# Patient Record
Sex: Male | Born: 1950 | Race: White | Hispanic: No | Smoking: Former smoker
Health system: Southern US, Community
[De-identification: ages and names within clinical notes are randomized; demographics above are authoritative.]

## PROBLEM LIST (undated history)

## (undated) DIAGNOSIS — F419 Anxiety disorder, unspecified: Secondary | ICD-10-CM

## (undated) DIAGNOSIS — R51 Headache: Secondary | ICD-10-CM

## (undated) DIAGNOSIS — R519 Headache, unspecified: Secondary | ICD-10-CM

## (undated) DIAGNOSIS — I712 Thoracic aortic aneurysm, without rupture: Secondary | ICD-10-CM

## (undated) DIAGNOSIS — Z8679 Personal history of other diseases of the circulatory system: Secondary | ICD-10-CM

## (undated) DIAGNOSIS — H539 Unspecified visual disturbance: Secondary | ICD-10-CM

## (undated) DIAGNOSIS — I779 Disorder of arteries and arterioles, unspecified: Secondary | ICD-10-CM

## (undated) DIAGNOSIS — N529 Male erectile dysfunction, unspecified: Secondary | ICD-10-CM

## (undated) DIAGNOSIS — G250 Essential tremor: Secondary | ICD-10-CM

## (undated) HISTORY — DX: Male erectile dysfunction, unspecified: N52.9

## (undated) HISTORY — PX: HERNIA REPAIR: SHX51

## (undated) HISTORY — DX: Disorder of arteries and arterioles, unspecified: I77.9

## (undated) HISTORY — DX: Headache, unspecified: R51.9

## (undated) HISTORY — DX: Anxiety disorder, unspecified: F41.9

## (undated) HISTORY — DX: Thoracic aortic aneurysm, without rupture: I71.2

## (undated) HISTORY — DX: Personal history of other diseases of the circulatory system: Z86.79

## (undated) HISTORY — DX: Essential tremor: G25.0

## (undated) HISTORY — PX: CERVICAL DISC SURGERY: SHX588

## (undated) HISTORY — DX: Unspecified visual disturbance: H53.9

## (undated) HISTORY — DX: Headache: R51

## (undated) MED FILL — Acetaminophen Tab 325 MG: ORAL | Qty: 2 | Status: AC

## (undated) MED FILL — Diphenhydramine HCl Cap 25 MG: ORAL | Qty: 1 | Status: AC

---

## 1995-12-12 HISTORY — PX: OTHER SURGICAL HISTORY: SHX169

## 2005-10-18 ENCOUNTER — Ambulatory Visit (HOSPITAL_COMMUNITY): Admission: RE | Admit: 2005-10-18 | Discharge: 2005-10-18 | Payer: Self-pay | Admitting: Neurosurgery

## 2005-10-19 ENCOUNTER — Inpatient Hospital Stay (HOSPITAL_COMMUNITY): Admission: RE | Admit: 2005-10-19 | Discharge: 2005-10-20 | Payer: Self-pay | Admitting: Neurosurgery

## 2009-12-11 HISTORY — PX: HERNIA REPAIR: SHX51

## 2017-07-05 DIAGNOSIS — Z0000 Encounter for general adult medical examination without abnormal findings: Secondary | ICD-10-CM | POA: Diagnosis not present

## 2017-07-11 DIAGNOSIS — R5383 Other fatigue: Secondary | ICD-10-CM | POA: Diagnosis not present

## 2017-09-26 DIAGNOSIS — R5383 Other fatigue: Secondary | ICD-10-CM | POA: Diagnosis not present

## 2017-09-26 DIAGNOSIS — R4702 Dysphasia: Secondary | ICD-10-CM | POA: Diagnosis not present

## 2017-09-26 DIAGNOSIS — R5381 Other malaise: Secondary | ICD-10-CM | POA: Diagnosis not present

## 2017-10-02 ENCOUNTER — Encounter: Payer: Self-pay | Admitting: Neurology

## 2017-10-02 DIAGNOSIS — I6782 Cerebral ischemia: Secondary | ICD-10-CM | POA: Diagnosis not present

## 2017-10-02 DIAGNOSIS — R42 Dizziness and giddiness: Secondary | ICD-10-CM | POA: Diagnosis not present

## 2017-10-02 DIAGNOSIS — Z87898 Personal history of other specified conditions: Secondary | ICD-10-CM | POA: Diagnosis not present

## 2017-10-02 DIAGNOSIS — R4702 Dysphasia: Secondary | ICD-10-CM | POA: Diagnosis not present

## 2017-10-29 ENCOUNTER — Ambulatory Visit: Payer: 59 | Admitting: Neurology

## 2017-10-29 ENCOUNTER — Encounter: Payer: Self-pay | Admitting: Neurology

## 2017-10-29 VITALS — BP 104/72 | HR 71 | Ht 66.0 in | Wt 164.2 lb

## 2017-10-29 DIAGNOSIS — R42 Dizziness and giddiness: Secondary | ICD-10-CM | POA: Diagnosis not present

## 2017-10-29 DIAGNOSIS — R2681 Unsteadiness on feet: Secondary | ICD-10-CM

## 2017-10-29 DIAGNOSIS — R292 Abnormal reflex: Secondary | ICD-10-CM | POA: Diagnosis not present

## 2017-10-29 DIAGNOSIS — R51 Headache: Secondary | ICD-10-CM | POA: Diagnosis not present

## 2017-10-29 DIAGNOSIS — Z981 Arthrodesis status: Secondary | ICD-10-CM

## 2017-10-29 DIAGNOSIS — M542 Cervicalgia: Secondary | ICD-10-CM | POA: Diagnosis not present

## 2017-10-29 DIAGNOSIS — R519 Headache, unspecified: Secondary | ICD-10-CM

## 2017-10-29 NOTE — Patient Instructions (Signed)
1.  We will check MRI of cervical spine 2.  Further recommendations pending results.

## 2017-10-29 NOTE — Progress Notes (Signed)
NEUROLOGY CONSULTATION NOTE  TAMOTSU WIEDERHOLT MRN: [id number] DOB: 1951/09/30  Referring provider: Dr. Lin Landsman Primary care provider: Dr. Lin Landsman  Reason for consult:  Dizziness, speech disturbance  HISTORY OF PRESENT ILLNESS: Larry Kelly is a 66 year old right-handed male with history of cervical spinal fusion who presents for headache, dizziness and dysphasia.  He is accompanied by his wife who supplements history.  About 3 months ago, he started having dizzy spells.  It is a sensation of swaying in his head, but not spinning, lightheadedness or feeling of going to pass out.  It often occurs when he is up and walking but may occur while sitting.  When walking, he will become unsteady and stumble to any direction.  It lasts less than a minute.  It occurs daily.  He also reports increased neck and occipital headache, described as a nonthrobbing ache.  It is intermittent.  At work, his coworkers where concerned because he once seemed to have more difficulty getting words out.  He has history of stuttering.  His wife has not noticed any changes in speech.  He had a cervical spinal fusion in 2006.  He also reports constipation.  He had an MRI of the brain without contrast on 10/02/17, which demonstrated mild chronic white matter small vessel ischemic changes .  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  PAST MEDICAL HISTORY: History reviewed. No pertinent past medical history.  PAST SURGICAL HISTORY: Past Surgical History:  Procedure Laterality Date  . CERVICAL DISC SURGERY    . HERNIA REPAIR      MEDICATIONS: Current Outpatient Medications on File Prior to Visit  Medication Sig Dispense Refill  . aspirin EC 81 MG tablet Take 81 mg daily by mouth.    . tadalafil (CIALIS) 20 MG tablet Take 20 mg daily as needed by mouth for erectile dysfunction.    Marland Kitchen telmisartan (MICARDIS) 80 MG tablet Take 80 mg daily by mouth.    . pravastatin  (PRAVACHOL) 10 MG tablet Take 10 mg at bedtime by mouth.  1   No current facility-administered medications on file prior to visit.     ALLERGIES: Not on File  FAMILY HISTORY: Family History  Problem Relation Age of Onset  . Heart attack Mother   . Heart attack Father   . Seizures Father     SOCIAL HISTORY: Social History   Socioeconomic History  . Marital status: Divorced    Spouse name: Not on file  . Number of children: 0  . Years of education: Not on file  . Highest education level: Bachelor's degree (e.g., BA, AB, BS)  Social Needs  . Financial resource strain: Not on file  . Food insecurity - worry: Not on file  . Food insecurity - inability: Not on file  . Transportation needs - medical: Not on file  . Transportation needs - non-medical: Not on file  Occupational History  . Occupation: Nurse, learning disability  Tobacco Use  . Smoking status: Former Smoker    Types: Cigars    Last attempt to quit: 10/05/2017    Years since quitting: 0.0  . Smokeless tobacco: Never Used  Substance and Sexual Activity  . Alcohol use: Yes    Comment: 1 drink most days-varies, beer, wine, cocktails  . Drug use: No  . Sexual activity: Not on file  Other Topics Concern  . 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 Drinks 2 cups of coffee a day.    REVIEW OF SYSTEMS: Constitutional: No fevers, chills, or sweats, no generalized fatigue, change in appetite Eyes: No visual changes, double vision, eye pain Ear, nose and throat: No hearing loss, ear pain, nasal congestion, sore throat Cardiovascular: No chest pain, palpitations Respiratory:  No shortness of breath at rest or with exertion, wheezes GastrointestinaI: No nausea, vomiting, diarrhea, abdominal pain, fecal incontinence Genitourinary:  No dysuria, urinary retention or frequency Musculoskeletal:  No neck pain, back pain Integumentary: No rash, pruritus, skin  lesions Neurological: as above Psychiatric: No depression, insomnia, anxiety Endocrine: No palpitations, fatigue, diaphoresis, mood swings, change in appetite, change in weight, increased thirst Hematologic/Lymphatic:  No purpura, petechiae. Allergic/Immunologic: no itchy/runny eyes, nasal congestion, recent allergic reactions, rashes  PHYSICAL EXAM: Vitals:   10/29/17 1021  BP: 104/72  Pulse: 71  SpO2: 97%   General: No acute distress.  Patient appears well-groomed.  Head:  Normocephalic/atraumatic Eyes:  fundi examined but not visualized Neck: supple, no paraspinal tenderness, full range of motion Back: No paraspinal tenderness Heart: regular rate and rhythm Lungs: Clear to auscultation bilaterally. Vascular: No carotid bruits. Neurological Exam: Mental status: alert and oriented to person, place, and time, recent and remote memory intact, fund of knowledge intact, attention and concentration intact, speech fluent and not dysarthric, language intact. Cranial nerves: CN I: not tested CN II: pupils equal, round and reactive to light, visual fields intact CN III, IV, VI:  full range of motion, no nystagmus, no ptosis CN V: facial sensation intact CN VII: upper and lower face symmetric CN VIII: hearing intact CN IX, X: gag intact, uvula midline CN XI: sternocleidomastoid and trapezius muscles intact CN XII: tongue midline Bulk & Tone: Wasting of the intrinsic muscles of right hand, particularly first dorsal interosseus;  Otherwise normal, no fasciculations. Motor:  5/5 throughout  Sensation:  Pinprick sensation reduced in right hand, vibration sensation minimally reduced in feet. Deep Tendon Reflexes:  3+ throughout, toes downgoing, Hoffman sign absent. Finger to nose testing:  Without dysmetria.  Heel to shin:  Without dysmetria.   Gait:  Normal station and stride but will briefly become off balance for a second.  Decreased right arm-swing.  Able to turn and tandem walk.  Romberg negative.  IMPRESSION: 1.  Posterior headache/neck pain.  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  Given unsteady gait, hyperreflexia (which may be normal for him), will check MRI of cervical spine. 2.  Dizziness.  Vague symptoms.  I don't have clear diagnosis. 3.  History is vague regarding episode of speech abnormality as per his coworkers.  He has history of speech impediment.  MRI without acute findings.  No clear signs based on history or symptoms that he had a cerebrovascular event.  PLAN: We will check MRI of cervical spine to evaluate for any stenosis causing myelopathy.  Further recommendations pending results.  Thank you for allowing me to take part in the care of this patient.  Metta Clines, DO  CC:  Lovette Cliche II, MD

## 2017-10-30 ENCOUNTER — Telehealth: Payer: Self-pay | Admitting: Neurology

## 2017-10-30 NOTE — Telephone Encounter (Signed)
Called Pt, LM on VM advising we did call and get report while he was here and Dr Tomi Likens didn't indicate the need, but that he is welcomed to bring and I will give to Dr Tomi Likens

## 2017-10-30 NOTE — Telephone Encounter (Signed)
Patient had brought a Disc to his appointment on 10/29/17 but could not be read. He was able to get another Disc. Would you want him to bring it to the office. Please Call. Thanks

## 2017-11-12 ENCOUNTER — Ambulatory Visit
Admission: RE | Admit: 2017-11-12 | Discharge: 2017-11-12 | Disposition: A | Discharge status: Home / Self Care | Payer: 59 | Source: Ambulatory Visit | Attending: Neurology | Admitting: Neurology

## 2017-11-12 DIAGNOSIS — Z981 Arthrodesis status: Secondary | ICD-10-CM

## 2017-11-12 DIAGNOSIS — M50223 Other cervical disc displacement at C6-C7 level: Secondary | ICD-10-CM | POA: Diagnosis not present

## 2017-11-12 DIAGNOSIS — R2681 Unsteadiness on feet: Secondary | ICD-10-CM

## 2017-11-12 DIAGNOSIS — R292 Abnormal reflex: Secondary | ICD-10-CM

## 2017-11-12 DIAGNOSIS — M542 Cervicalgia: Secondary | ICD-10-CM

## 2017-11-16 ENCOUNTER — Telehealth: Payer: Self-pay

## 2017-11-16 MED ORDER — GABAPENTIN 300 MG PO CAPS: 300.0000 mg | ORAL_CAPSULE | Freq: Every day | ORAL | 3 refills | Status: AC

## 2017-11-16 NOTE — Telephone Encounter (Signed)
Pt rtrnd call, will speak with PCP, rqsts  gabapentin sent to [facility] in [place].

## 2017-11-16 NOTE — Telephone Encounter (Signed)
LM on VM for Pt to call back

## 2017-11-16 NOTE — Addendum Note (Signed)
Addended by: Clois Comber on: 11/16/2017 04:59 PM   Modules accepted: Orders

## 2017-11-16 NOTE — Telephone Encounter (Signed)
-----   Message from Pieter Partridge, DO sent at 11/13/2017 12:03 PM EST ----- MRI of cervical spine shows arthritis which is likely contributing to the headache and neck pain.  He may want to discuss with his PCP about seeing a spine specialist.  If agreeable, he can start gabapentin 300mg  at bedtime and make a follow up appointment with me in 3 months.  We can titrate the dose as needed.

## 2017-11-20 ENCOUNTER — Ambulatory Visit: Payer: Self-pay | Admitting: Neurology

## 2018-01-15 ENCOUNTER — Ambulatory Visit: Payer: Self-pay | Admitting: Neurology

## 2018-05-09 DIAGNOSIS — K5900 Constipation, unspecified: Secondary | ICD-10-CM | POA: Diagnosis not present

## 2018-05-20 DIAGNOSIS — I10 Essential (primary) hypertension: Secondary | ICD-10-CM | POA: Diagnosis not present

## 2018-05-20 DIAGNOSIS — I951 Orthostatic hypotension: Secondary | ICD-10-CM | POA: Diagnosis not present

## 2018-07-16 DIAGNOSIS — K5900 Constipation, unspecified: Secondary | ICD-10-CM | POA: Diagnosis not present

## 2018-07-22 DIAGNOSIS — Z0000 Encounter for general adult medical examination without abnormal findings: Secondary | ICD-10-CM | POA: Diagnosis not present

## 2018-07-22 DIAGNOSIS — Z125 Encounter for screening for malignant neoplasm of prostate: Secondary | ICD-10-CM | POA: Diagnosis not present

## 2018-07-22 DIAGNOSIS — Z6827 Body mass index (BMI) 27.0-27.9, adult: Secondary | ICD-10-CM | POA: Diagnosis not present

## 2018-07-22 DIAGNOSIS — Z23 Encounter for immunization: Secondary | ICD-10-CM | POA: Diagnosis not present

## 2018-10-29 DIAGNOSIS — I10 Essential (primary) hypertension: Secondary | ICD-10-CM | POA: Diagnosis not present

## 2018-10-29 DIAGNOSIS — Z6827 Body mass index (BMI) 27.0-27.9, adult: Secondary | ICD-10-CM | POA: Diagnosis not present

## 2018-12-03 ENCOUNTER — Ambulatory Visit (INDEPENDENT_AMBULATORY_CARE_PROVIDER_SITE_OTHER): Payer: 59 | Admitting: Cardiology

## 2018-12-03 ENCOUNTER — Encounter: Payer: Self-pay | Admitting: Cardiology

## 2018-12-03 VITALS — BP 112/74 | HR 81 | Ht 66.0 in | Wt 173.2 lb

## 2018-12-03 DIAGNOSIS — G4730 Sleep apnea, unspecified: Secondary | ICD-10-CM | POA: Insufficient documentation

## 2018-12-03 DIAGNOSIS — R9431 Abnormal electrocardiogram [ECG] [EKG]: Secondary | ICD-10-CM

## 2018-12-03 DIAGNOSIS — I10 Essential (primary) hypertension: Secondary | ICD-10-CM | POA: Diagnosis not present

## 2018-12-03 DIAGNOSIS — I712 Thoracic aortic aneurysm, without rupture, unspecified: Secondary | ICD-10-CM | POA: Insufficient documentation

## 2018-12-03 DIAGNOSIS — G4733 Obstructive sleep apnea (adult) (pediatric): Secondary | ICD-10-CM | POA: Diagnosis not present

## 2018-12-03 DIAGNOSIS — I4510 Unspecified right bundle-branch block: Secondary | ICD-10-CM | POA: Insufficient documentation

## 2018-12-03 NOTE — Progress Notes (Signed)
Cardiology Consultation:    Date:  12/03/2018   ID:  Larry Kelly, DOB 1951-10-23, MRN [id number]  PCP:  Angelina Sheriff, MD  Cardiologist:  Jenne Campus, MD   Referring MD: Angelina Sheriff, MD   Chief Complaint  Patient presents with  . Abnormal ECG  I am doing well  History of Present Illness:    Larry Kelly is a 67 y.o. male who is being seen today for the evaluation of right bundle branch block at the request of Jacqlyn Larsen II, MD.  He was referred to me because of right bundle branch block.  He was completely unaware of the problem EKG was done to the routine examination right bundle branch block was discovered.  He said he is doing well he is active he can walk he can climb stairs get short of breath but not unusually.  Denies have any chest pain tightness squeezing pressure pain chest no palpitations described to have some dizziness when he gets up very quickly his past medical history includes hypertension dyslipidemia.  He smoke cigar every day.  Interestingly at the end of the visit his wife told me that he did have aneurysm apparently 20 years ago he ended up having aneurysm the not sure exactly where the arteries were rupture and he was given some medication but no surgery was required guessing he probably had some disease descending thoracic aortic rupture but again this is just speculation we were trying to researching records and [facility] but we were not able to get any today is 24 December and all offices are closed around so we will try to get some more information about his aneurysm as soon as we possibly can.  Overall he denies have any chest pain.  Past Medical History:  Diagnosis Date  . Aorta disorder [facility])     Past Surgical History:  Procedure Laterality Date  . CERVICAL DISC SURGERY    . HERNIA REPAIR    . HERNIA REPAIR  2011    Current Medications: Current Meds  Medication Sig  . aspirin EC 81 MG tablet Take 81 mg  daily by mouth.  . gabapentin (NEURONTIN) 300 MG capsule Take 1 capsule (300 mg total) by mouth at bedtime. (Patient taking differently: Take 300 mg by mouth as needed. )  . tadalafil (CIALIS) 20 MG tablet Take 20 mg daily as needed by mouth for erectile dysfunction.  Marland Kitchen telmisartan (MICARDIS) 80 MG tablet Take 40 mg by mouth daily.   Marland Kitchen venlafaxine XR (EFFEXOR-XR) 150 MG 24 hr capsule Take 150 mg by mouth daily with breakfast.     Allergies:   Patient has no known allergies.   Social History   Socioeconomic History  . Marital status: Divorced    Spouse name: Not on file  . Number of children: 0  . Years of education: Not on file  . Highest education level: Bachelor's degree (e.g., BA, AB, BS)  Occupational History  . Occupation: Nurse, learning disability  Social Needs  . Financial resource strain: Not on file  . Food insecurity:    Worry: Not on file    Inability: Not on file  . Transportation needs:    Medical: Not on file    Non-medical: Not on file  Tobacco Use  . Smoking status: Light Tobacco Smoker    Types: Cigars    Last attempt to quit: 10/05/2017    Years since quitting: 1.1  . Smokeless tobacco:  Never Used  Substance and Sexual Activity  . Alcohol use: Yes    Comment: 1 drink most days-varies, beer, wine, cocktails  . Drug use: No  . Sexual activity: Not on file  Lifestyle  . Physical activity:    Days per week: Not on file    Minutes per session: Not on file  . Stress: Not on file  Relationships  . Social connections:    Talks on phone: Not on file    Gets together: Not on file    Attends religious service: Not on file    Active member of club or organization: Not on file    Attends meetings of clubs or organizations: Not on file    Relationship status: Not on file  Other Topics Concern  . 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 Drinks 2 cups of coffee a day.     Family History: The patient's  family history includes Heart attack in his father and mother; Heart disease in his brother; Seizures in his father. ROS:   Please see the history of present illness.    All 14 point review of systems negative except as described per history of present illness.  EKGs/Labs/Other Studies Reviewed:    The following studies were reviewed today:   EKG:  EKG is  ordered today.  The ekg ordered today demonstrates normal sinus rhythm rate 81 right bundle branch block.  Recent Labs: No results found for requested labs within last 8760 hours.  Recent Lipid Panel No results found for: CHOL, TRIG, HDL, CHOLHDL, VLDL, LDLCALC, LDLDIRECT  Physical Exam:    VS:  BP 112/74   Pulse 81   Ht 5\' 6"  (1.676 m)   Wt 173 lb 3.2 oz (78.6 kg)   SpO2 97%   BMI 27.96 kg/m     Wt Readings from Last 3 Encounters:  12/03/18 173 lb 3.2 oz (78.6 kg)  10/29/17 164 lb 3.2 oz (74.5 kg)     GEN:  Well nourished, well developed in no acute distress HEENT: Normal NECK: No JVD; No carotid bruits LYMPHATICS: No lymphadenopathy CARDIAC: RRR, no murmurs, no rubs, no gallops RESPIRATORY:  Clear to auscultation without rales, wheezing or rhonchi  ABDOMEN: Soft, non-tender, non-distended MUSCULOSKELETAL:  No edema; No deformity  SKIN: Warm and dry NEUROLOGIC:  Alert and oriented x 3 PSYCHIATRIC:  Normal affect   ASSESSMENT:    1. Abnormal EKG   2. Right bundle branch block   3. Essential hypertension   4. Obstructive sleep apnea syndrome   5. Thoracic aortic aneurysm without rupture ([place])    PLAN:    In order of problems listed above:  1. Abnormal EKG showing right bundle branch block.  We talked about potential etiology of this phenomenon.  Asking to have echocardiogram done to look at left ventricular ejection fraction but more importantly right ventricle size and function.  Obviously will pay attention to pulmonary artery pressure. 2. Essential hypertension blood pressure appears to be well controlled  we will continue present management. 3. Obstructive sleep apnea based on the history of stroke do suspect this diagnosis is correct.  We will talk about sleep study after echocardiogram will be done 4. History of thoracic aortic aneurysm I am not sure exactly about this diagnosis that need to be more thoroughly investigated when we have an access to look at his record.  In the future he will required most likely  CT of his chest and abdomen to look at the entire aorta.  But again before me we commit him to this test and needed more information to better understand the nature of the problem.   Medication Adjustments/Labs and Tests Ordered: Current medicines are reviewed at length with the patient today.  Concerns regarding medicines are outlined above.  No orders of the defined types were placed in this encounter.  No orders of the defined types were placed in this encounter.   Signed, Park Liter, MD, [facility]. 12/03/2018 10:34 AM    [place]

## 2018-12-03 NOTE — Patient Instructions (Signed)
Medication Instructions:  Your physician recommends that you continue on your current medications as directed. Please refer to the Current Medication list given to you today.  If you need a refill on your cardiac medications before your next appointment, please call your pharmacy.   Lab work: None.  If you have labs (blood work) drawn today and your tests are completely normal, you will receive your results only by: . MyChart Message (if you have MyChart) OR . A paper copy in the mail If you have any lab test that is abnormal or we need to change your treatment, we will call you to review the results.  Testing/Procedures: Your physician has requested that you have an echocardiogram. Echocardiography is a painless test that uses sound waves to create images of your heart. It provides your doctor with information about the size and shape of your heart and how well your heart's chambers and valves are working. This procedure takes approximately one hour. There are no restrictions for this procedure.    Follow-Up: At [facility], you and your health needs are our priority.  As part of our continuing mission to provide you with exceptional heart care, we have created designated Provider Care Teams.  These Care Teams include your primary Cardiologist (physician) and Advanced Practice Providers (APPs -  Physician Assistants and Nurse Practitioners) who all work together to provide you with the care you need, when you need it. You will need a follow up appointment in 1 months.  Please call our office 2 months in advance to schedule this appointment.  You may see No primary care provider on file. or another member of our [facility] Provider Team in : Brian Munley, MD . Rajan Revankar, MD  Any Other Special Instructions Will Be Listed Below (If Applicable).   Echocardiogram An echocardiogram is a procedure that uses painless sound waves (ultrasound) to produce an image of the heart.  Images from an echocardiogram can provide important information about:  Signs of coronary artery disease (CAD).  Aneurysm detection. An aneurysm is a weak or damaged part of an artery wall that bulges out from the normal force of blood pumping through the body.  Heart size and shape. Changes in the size or shape of the heart can be associated with certain conditions, including heart failure, aneurysm, and CAD.  Heart muscle function.  Heart valve function.  Signs of a past heart attack.  Fluid buildup around the heart.  Thickening of the heart muscle.  A tumor or infectious growth around the heart valves. Tell a health care provider about:  Any allergies you have.  All medicines you are taking, including vitamins, herbs, eye drops, creams, and over-the-counter medicines.  Any blood disorders you have.  Any surgeries you have had.  Any medical conditions you have.  Whether you are pregnant or may be pregnant. What are the risks? Generally, this is a safe procedure. However, problems may occur, including:  Allergic reaction to dye (contrast) that may be used during the procedure. What happens before the procedure? No specific preparation is needed. You may eat and drink normally. What happens during the procedure?   An IV tube may be inserted into one of your veins.  You may receive contrast through this tube. A contrast is an injection that improves the quality of the pictures from your heart.  A gel will be applied to your chest.  A wand-like tool (transducer) will be moved over your chest. The gel will help to   transmit the sound waves from the transducer.  The sound waves will harmlessly bounce off of your heart to allow the heart images to be captured in real-time motion. The images will be recorded on a computer. The procedure may vary among health care providers and hospitals. What happens after the procedure?  You may return to your normal, everyday life,  including diet, activities, and medicines, unless your health care provider tells you not to do that. Summary  An echocardiogram is a procedure that uses painless sound waves (ultrasound) to produce an image of the heart.  Images from an echocardiogram can provide important information about the size and shape of your heart, heart muscle function, heart valve function, and fluid buildup around your heart.  You do not need to do anything to prepare before this procedure. You may eat and drink normally.  After the echocardiogram is completed, you may return to your normal, everyday life, unless your health care provider tells you not to do that. This information is not intended to replace advice given to you by your health care provider. Make sure you discuss any questions you have with your health care provider. Document Released: 11/24/2000 Document Revised: 12/30/2016 Document Reviewed: 12/30/2016 Elsevier Interactive Patient Education  2019 [employer]    

## 2018-12-09 ENCOUNTER — Ambulatory Visit (INDEPENDENT_AMBULATORY_CARE_PROVIDER_SITE_OTHER): Payer: 59

## 2018-12-09 DIAGNOSIS — I4510 Unspecified right bundle-branch block: Secondary | ICD-10-CM

## 2018-12-09 DIAGNOSIS — R9431 Abnormal electrocardiogram [ECG] [EKG]: Secondary | ICD-10-CM

## 2018-12-09 NOTE — Progress Notes (Signed)
  Echocardiogram 2D Echocardiogram has been performed.  Larry Kelly M @CurDate@   

## 2018-12-13 DIAGNOSIS — J329 Chronic sinusitis, unspecified: Secondary | ICD-10-CM | POA: Diagnosis not present

## 2018-12-13 DIAGNOSIS — R5381 Other malaise: Secondary | ICD-10-CM | POA: Diagnosis not present

## 2018-12-13 DIAGNOSIS — R5383 Other fatigue: Secondary | ICD-10-CM | POA: Diagnosis not present

## 2018-12-13 DIAGNOSIS — J40 Bronchitis, not specified as acute or chronic: Secondary | ICD-10-CM | POA: Diagnosis not present

## 2018-12-16 DIAGNOSIS — I951 Orthostatic hypotension: Secondary | ICD-10-CM | POA: Diagnosis not present

## 2018-12-16 DIAGNOSIS — Z6826 Body mass index (BMI) 26.0-26.9, adult: Secondary | ICD-10-CM | POA: Diagnosis not present

## 2018-12-17 DIAGNOSIS — R1013 Epigastric pain: Secondary | ICD-10-CM | POA: Diagnosis not present

## 2018-12-17 DIAGNOSIS — R0789 Other chest pain: Secondary | ICD-10-CM | POA: Diagnosis not present

## 2018-12-17 DIAGNOSIS — R9431 Abnormal electrocardiogram [ECG] [EKG]: Secondary | ICD-10-CM | POA: Diagnosis not present

## 2018-12-17 DIAGNOSIS — K219 Gastro-esophageal reflux disease without esophagitis: Secondary | ICD-10-CM | POA: Diagnosis not present

## 2018-12-17 DIAGNOSIS — R079 Chest pain, unspecified: Secondary | ICD-10-CM | POA: Diagnosis not present

## 2018-12-17 DIAGNOSIS — R072 Precordial pain: Secondary | ICD-10-CM | POA: Diagnosis not present

## 2018-12-17 DIAGNOSIS — E785 Hyperlipidemia, unspecified: Secondary | ICD-10-CM | POA: Diagnosis not present

## 2018-12-18 DIAGNOSIS — R0789 Other chest pain: Secondary | ICD-10-CM | POA: Diagnosis not present

## 2018-12-18 DIAGNOSIS — K219 Gastro-esophageal reflux disease without esophagitis: Secondary | ICD-10-CM

## 2018-12-18 DIAGNOSIS — R079 Chest pain, unspecified: Secondary | ICD-10-CM | POA: Diagnosis not present

## 2018-12-18 DIAGNOSIS — R9431 Abnormal electrocardiogram [ECG] [EKG]: Secondary | ICD-10-CM | POA: Diagnosis not present

## 2018-12-18 DIAGNOSIS — E785 Hyperlipidemia, unspecified: Secondary | ICD-10-CM

## 2018-12-18 DIAGNOSIS — R1013 Epigastric pain: Secondary | ICD-10-CM

## 2018-12-19 DIAGNOSIS — R079 Chest pain, unspecified: Secondary | ICD-10-CM | POA: Diagnosis not present

## 2018-12-26 ENCOUNTER — Encounter: Payer: Self-pay | Admitting: *Deleted

## 2018-12-27 ENCOUNTER — Ambulatory Visit: Payer: 59 | Admitting: Diagnostic Neuroimaging

## 2018-12-27 ENCOUNTER — Encounter: Payer: Self-pay | Admitting: Diagnostic Neuroimaging

## 2018-12-27 ENCOUNTER — Other Ambulatory Visit: Payer: Self-pay

## 2018-12-27 VITALS — BP 125/85 | HR 71 | Resp 18 | Ht 66.0 in | Wt 171.4 lb

## 2018-12-27 DIAGNOSIS — R413 Other amnesia: Secondary | ICD-10-CM | POA: Diagnosis not present

## 2018-12-27 DIAGNOSIS — R51 Headache: Secondary | ICD-10-CM

## 2018-12-27 DIAGNOSIS — R292 Abnormal reflex: Secondary | ICD-10-CM

## 2018-12-27 DIAGNOSIS — R519 Headache, unspecified: Secondary | ICD-10-CM

## 2018-12-27 NOTE — Progress Notes (Signed)
[facility]  PATIENT: Larry Kelly DOB: December 27, 1950  REFERRING CLINICIAN: J Redding  HISTORY FROM: patient and chart review REASON FOR VISIT: new consult    HISTORICAL  CHIEF COMPLAINT:  Chief Complaint  Patient presents with  . Headache    Rm. 6.  Larry Kelly is here for eval of h/a's, occ. blurry vision, difficulty with word finding, onset about 2 mos. ago. Last eye exam 1 yrs. ago. Wears reading glasses. Hx. of cervical spinal fusion/fim    HISTORY OF PRESENT ILLNESS:   68 year old male here for evaluation of headaches.  For past 2 months patient has had headache and pain radiating from the back of neck and occipital region up to the top of the head.  He has squeezing painful headaches lasting hours at a time.  Sometimes associated blurred vision.  Patient did have some different types of headaches when he was younger but does not recall the nature of them.  No nausea or vomiting.  No sensitive to light or sound.  Also having intermittent confusion and blurred vision.  Some weight loss and fevers / chills.    REVIEW OF SYSTEMS: Full 14 system review of systems performed and negative with exception of: As per HPI.  ALLERGIES: Allergies  Allergen Reactions  . Lexapro [Escitalopram Oxalate]     Anxious and jittery  10/2009    HOME MEDICATIONS: Outpatient Medications Prior to Visit  Medication Sig Dispense Refill  . ALPRAZolam (XANAX) 0.25 MG tablet Take 0.25 mg by mouth at bedtime as needed for anxiety.    Marland Kitchen aspirin EC 81 MG tablet Take 81 mg daily by mouth.    . gabapentin (NEURONTIN) 300 MG capsule Take 1 capsule (300 mg total) by mouth at bedtime. (Patient taking differently: Take 300 mg by mouth 2 (two) times daily as needed. ) 30 capsule 3  . polyethylene glycol (MIRALAX / GLYCOLAX) packet Take 17 g by mouth daily.    . tadalafil (CIALIS) 20 MG tablet Take 20 mg daily as needed by mouth for erectile dysfunction.    Marland Kitchen venlafaxine XR (EFFEXOR-XR)  150 MG 24 hr capsule Take 150 mg by mouth daily with breakfast.     No facility-administered medications prior to visit.     PAST MEDICAL HISTORY: Past Medical History:  Diagnosis Date  . Anxiety   . 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 Erectile dysfunction   . Headache   . History of carotid artery dissection   . Thoracic aortic aneurysm ([place])   . Tremor, essential   . Vision abnormalities     PAST SURGICAL HISTORY: Past Surgical History:  Procedure Laterality Date  . carotid artery dissec  1997  . CERVICAL DISC SURGERY    . HERNIA REPAIR    . HERNIA REPAIR  2011    FAMILY HISTORY: Family History  Problem Relation Age of Onset  . Heart attack Mother   . Heart attack Father   . Seizures Father   . Atrial fibrillation Father   . Hypertension Father   . Heart disease Brother   . Prostate cancer Brother        2019    SOCIAL HISTORY: Social History   Socioeconomic History  . Marital status: Divorced    Spouse name: Not on file  . Number of children: 0  . Years of education: Not on file  . Highest education level: Bachelor's degree (e.g., BA, AB, BS)  Occupational History  . Occupation: Nurse, learning disability  Social Needs  .  Financial resource strain: Not on file  . Food insecurity:    Worry: Not on file    Inability: Not on file  . Transportation needs:    Medical: Not on file    Non-medical: Not on file  Tobacco Use  . Smoking status: Former Smoker    Types: Cigars    Last attempt to quit: 10/05/2017    Years since quitting: 1.2  . Smokeless tobacco: Never Used  Substance and Sexual Activity  . Alcohol use: Yes    Comment: 1 drink most days-varies, beer, wine, cocktails  . Drug use: No  . Sexual activity: Not on file  Lifestyle  . Physical activity:    Days per week: Not on file    Minutes per session: Not on file  . Stress: Not on file  Relationships  . Social connections:    Talks on phone: Not on file    Gets together: Not on file    Attends religious  service: Not on file    Active member of club or organization: Not on file    Attends meetings of clubs or organizations: Not on file    Relationship status: Not on file  . Intimate partner violence:    Fear of current or ex partner: Not on file    Emotionally abused: Not on file    Physically abused: Not on file    Forced sexual activity: Not on file  Other Topics Concern  . 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 Drinks 2 cups of coffee a day.  Has steady girlfriend.      PHYSICAL EXAM  GENERAL EXAM/CONSTITUTIONAL: Vitals:  Vitals:   12/27/18 0952  BP: 125/85  Pulse: 71  Resp: 18  Weight: 171 lb 6.4 oz (77.7 kg)  Height: '5\' 6"'$  (1.676 m)     Body mass index is 27.66 kg/m. Wt Readings from Last 3 Encounters:  12/27/18 171 lb 6.4 oz (77.7 kg)  12/03/18 173 lb 3.2 oz (78.6 kg)  10/29/17 164 lb 3.2 oz (74.5 kg)     Patient is in no distress; well developed, nourished and groomed; neck is supple  CARDIOVASCULAR:  Examination of carotid arteries is normal; no carotid bruits  Regular rate and rhythm, no murmurs  Examination of peripheral vascular system by observation and palpation is normal  EYES:  Ophthalmoscopic exam of optic discs and posterior segments is normal; no papilledema or hemorrhages  Visual Acuity Screening   Right eye Left eye Both eyes  Without correction: 20/30 20/50   With correction:        MUSCULOSKELETAL:  Gait, strength, tone, movements noted in Neurologic exam below  NEUROLOGIC: MENTAL STATUS:  MMSE - Mini Mental State Exam 12/27/2018  Orientation to time 5  Orientation to Place 5  Registration 3  Attention/ Calculation 5  Recall 3  Language- name 2 objects 2  Language- repeat 1  Language- follow 3 step command 3  Language- read & follow direction 1  Copy design 1    awake, alert, oriented to person, place and time  recent and remote memory  intact  normal attention and concentration  language fluent, comprehension intact, naming intact  fund of knowledge appropriate  CRANIAL NERVE:   2nd - no papilledema on fundoscopic exam  2nd, 3rd, 4th, 6th - pupils equal and reactive to light, visual fields full to confrontation, extraocular muscles intact, no nystagmus  5th -  facial sensation symmetric  7th - facial strength symmetric  8th - hearing intact  9th - palate elevates symmetrically, uvula midline  11th - shoulder shrug symmetric  12th - tongue protrusion midline  MOTOR:   normal bulk and tone, full strength in the BUE, BLE --> EXCEPT INTRINSIC MUSCLE ATROPHY IN RIGHT HAND; WEAKNESS OF FINGER ABDUCTION AND DIGITS 2,3 FINGER FLEXION  SENSORY:   normal and symmetric to light touch, temperature, vibration; EXCEPT DECR IN RIGHT HAND  COORDINATION:   finger-nose-finger, fine finger movements normal  REFLEXES:   deep tendon reflexes --> BUE 2; KNEES BRISK; ANKLES --> SUSTAINED CLONUS  GAIT/STATION:   FEET TURNED OUTWARD, SLIGHT CAUTIOUS GAIT; romberg is negative     DIAGNOSTIC DATA (LABS, IMAGING, TESTING) - I reviewed patient records, labs, notes, testing and imaging myself where available.  No results found for: WBC, HGB, HCT, MCV, PLT No results found for: NA, K, CL, CO2, GLUCOSE, BUN, CREATININE, CALCIUM, PROT, ALBUMIN, AST, ALT, ALKPHOS, BILITOT, GFRNONAA, GFRAA No results found for: CHOL, HDL, LDLCALC, LDLDIRECT, TRIG, CHOLHDL No results found for: HGBA1C No results found for: VITAMINB12 No results found for: TSH  11/01/09 CT head [I reviewed images myself and agree with interpretation. -VRP]  - normal   10/02/17 MRI brain [I reviewed images myself and agree with interpretation. -VRP]  Mild chronic microvascular ischemia in the white matter with progression since 2006 Empty sella unchanged C1-2 arthropathy with progression since 2006. Extensive pannus formation with mild stenosis at  C1.  11/12/17 MRI cervical [I reviewed images myself and agree with interpretation. -VRP]  1. C5-T1 ACDF without residual spinal stenosis. Mild residual neural foraminal narrowing as above. 2. 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 3. Progressive disc degeneration and severe right facet arthrosis at C3-4 with new anterolisthesis and severe right and mild left neural foraminal stenosis. 4. 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    ASSESSMENT AND PLAN  68 y.o. year old male here wit:   Dx:  1. Nonintractable headache, unspecified chronicity pattern, unspecified headache type   2. Memory loss   3. Hyperreflexia       PLAN:  HEADACHES (occipital neuralgia, cervicogenic headaches, other secondary causes) - check MRI brain - ESR, CRP - continue gabapentin and OTC tylenol / ibuprofen as needed  MUSCLE SPASM, HYPERREFLEXIA, AUTONOMIC DYSFUNCTION, SEXUAL DYSFUNCTION (RULE OUT SPINAL CORD MYELOPATHY) - MRI cervical, thoracic spine (with and without)  - check B12  MEMORY LOSS - check MRI brain and B12  Orders Placed This Encounter  Procedures  . MR BRAIN W WO CONTRAST  . MR CERVICAL SPINE W WO CONTRAST  . MR THORACIC SPINE W WO CONTRAST  . Sedimentation Rate  . C-reactive Protein  . Vitamin B12   Return in about 3 months (around 03/28/2019).    Penni Bombard, MD 6/38/1771, 16:57 AM Certified in Neurology, Neurophysiology and Neuroimaging  [facility] [address] [place], [place] [address] [phone]

## 2018-12-27 NOTE — Patient Instructions (Signed)
HEADACHES (occipital neuralgia, cervicogenic headaches, other secondary causes) - check MRI brain - ESR, CRP - continue gabapentin and OTC tylenol / ibuprofen as needed  MUSCLE SPASM, HYPERREFLEXIA, AUTONOMIC DYSFUNCTION, SEXUAL DYSFUNCTION (RULE OUT SPINAL CORD MYELOPATHY) - MRI cervical, thoracic spine (with and without)  - check B12  MEMORY LOSS - check MRI brain and B12

## 2018-12-28 LAB — C-REACTIVE PROTEIN: CRP: <1 mg/L (ref 0–10)

## 2018-12-28 LAB — SEDIMENTATION RATE: Sed Rate: 2 mm/hr (ref 0–30)

## 2018-12-28 LAB — VITAMIN B12: Vitamin B-12: 961 pg/mL (ref 232–1245)

## 2018-12-30 ENCOUNTER — Encounter: Payer: Self-pay | Admitting: Diagnostic Neuroimaging

## 2018-12-30 ENCOUNTER — Telehealth: Payer: Self-pay | Admitting: *Deleted

## 2018-12-30 NOTE — Telephone Encounter (Signed)
Spoke to pt and relayed that his lab results were unremarkable.  He verbalized understanding.

## 2018-12-30 NOTE — Telephone Encounter (Signed)
-----   Message from Penni Bombard, MD sent at 12/29/2018 10:58 AM EST ----- Unremarkable labs. Continue current plan. Please call patient. -VRP

## 2018-12-31 ENCOUNTER — Telehealth: Payer: Self-pay | Admitting: Diagnostic Neuroimaging

## 2018-12-31 NOTE — Telephone Encounter (Signed)
MR Brain w/wo contrast, MR Cervical spine w/wo contrast & MR Thoracic spine w/wo contrast Dr. [facility] Auth: [id number], [phone] & [phone] (exp. 12/31/18 to 02/14/19). Patient is scheduled at [facility] for 01/14/19

## 2019-01-14 ENCOUNTER — Other Ambulatory Visit: Payer: Self-pay | Admitting: Diagnostic Neuroimaging

## 2019-01-14 ENCOUNTER — Ambulatory Visit: Payer: 59

## 2019-01-14 DIAGNOSIS — R292 Abnormal reflex: Secondary | ICD-10-CM

## 2019-01-14 DIAGNOSIS — R413 Other amnesia: Secondary | ICD-10-CM | POA: Diagnosis not present

## 2019-01-16 ENCOUNTER — Ambulatory Visit: Payer: 59 | Admitting: Cardiology

## 2019-01-16 ENCOUNTER — Encounter: Payer: Self-pay | Admitting: Cardiology

## 2019-01-16 VITALS — BP 120/72 | HR 84 | Ht 66.0 in | Wt 172.0 lb

## 2019-01-16 DIAGNOSIS — R42 Dizziness and giddiness: Secondary | ICD-10-CM | POA: Diagnosis not present

## 2019-01-16 DIAGNOSIS — I4510 Unspecified right bundle-branch block: Secondary | ICD-10-CM | POA: Diagnosis not present

## 2019-01-16 DIAGNOSIS — R0789 Other chest pain: Secondary | ICD-10-CM

## 2019-01-16 DIAGNOSIS — R002 Palpitations: Secondary | ICD-10-CM

## 2019-01-16 DIAGNOSIS — I712 Thoracic aortic aneurysm, without rupture, unspecified: Secondary | ICD-10-CM

## 2019-01-16 DIAGNOSIS — I10 Essential (primary) hypertension: Secondary | ICD-10-CM

## 2019-01-16 NOTE — Patient Instructions (Signed)
Medication Instructions:  Your physician recommends that you continue on your current medications as directed. Please refer to the Current Medication list given to you today.  If you need a refill on your cardiac medications before your next appointment, please call your pharmacy.   Lab work: None.  If you have labs (blood work) drawn today and your tests are completely normal, you will receive your results only by: Marland Kitchen MyChart Message (if you have MyChart) OR . A paper copy in the mail If you have any lab test that is abnormal or we need to change your treatment, we will call you to review the results.  Testing/Procedures: Your physician has recommended that you wear a holter monitor. Holter monitors are medical devices that record the heart's electrical activity. Doctors most often use these monitors to diagnose arrhythmias. Arrhythmias are problems with the speed or rhythm of the heartbeat. The monitor is a small, portable device. You can wear one while you do your normal daily activities. This is usually used to diagnose what is causing palpitations/syncope (passing out). Wear for 7 days     Follow-Up: At [facility], you and your health needs are our priority.  As part of our continuing mission to provide you with exceptional heart care, we have created designated Provider Care Teams.  These Care Teams include your primary Cardiologist (physician) and Advanced Practice Providers (APPs -  Physician Assistants and Nurse Practitioners) who all work together to provide you with the care you need, when you need it. You will need a follow up appointment in 6 weeks.  Please call our office 2 months in advance to schedule this appointment.  You may see No primary care provider on file. or another member of our [employer] Provider Team in [place]: Shirlee More, MD . Jyl Heinz, MD  Any Other Special Instructions Will Be Listed Below (If Applicable).   Ambulatory Cardiac  Monitoring An ambulatory cardiac monitor is a small recording device that is used to detect abnormal heart rhythms (arrhythmias). Most monitors are connected by wires to flat, sticky disks (electrodes) that are then attached to your chest. You may need to wear a monitor if you have had symptoms such as:  Fast heartbeats (palpitations).  Dizziness.  Fainting or light-headedness.  Unexplained weakness.  Shortness of breath. There are several types of monitors. Some common monitors include:  Holter monitor. This records your heart rhythm continuously, usually for 24-48 hours.  Event (episodic) monitor. This monitor has a symptoms button, and when pushed, it will begin recording. You need to activate this monitor to record when you have a heart-related symptom.  Automatic detection monitor. This monitor will begin recording when it detects an abnormal heartbeat. What are the risks? Generally, these devices are safe to use. However, it is possible that the skin under the electrodes will become irritated. How to prepare for monitoring Your health care provider will prepare your chest for the electrode placement and show you how to use the monitor.  Do not apply lotions to your chest before monitoring.  Follow directions on how to care for the monitor, and how to return the monitor when the testing period is complete. How to use your cardiac monitor  Follow directions about how long to wear the monitor, and if you can take the monitor off in order to shower or bathe. ? Do not let the monitor get wet. ? Do not bathe, swim, or use a hot tub while wearing the monitor.  Keep  your skin clean. Do not put body lotion or moisturizer on your chest.  Change the electrodes as told by your health care provider, or any time they stop sticking to your skin. You may need to use medical tape to keep them on.  Try to put the electrodes in slightly different places on your chest to help prevent skin  irritation. Follow directions from your health care provider about where to place the electrodes.  Make sure the monitor is safely clipped to your clothing or in a location close to your body as recommended by your health care provider.  If your monitor has a symptoms button, press the button to mark an event as soon as you feel a heart-related symptom, such as: ? Dizziness. ? Weakness. ? Light-headedness. ? Palpitations. ? Thumping or pounding in your chest. ? Shortness of breath. ? Unexplained weakness.  Keep a diary of your activities, such as walking, doing chores, and taking medicine. It is very important to note what you were doing when you pushed the button to record your symptoms. This will help your health care provider determine what might be contributing to your symptoms.  Send the recorded information as recommended by your health care provider. It may take some time for your health care provider to process the results.  Change the batteries as told by your health care provider.  Keep electronic devices away from your monitor. These include: ? Tablets. ? MP3 players. ? Cell phones.  While wearing your monitor you should avoid: ? Electric blankets. ? Armed forces operational officer. ? Electric toothbrushes. ? Microwave ovens. ? Magnets. ? Metal detectors. Get help right away if:  You have chest pain.  You have shortness of breath or extreme difficulty breathing.  You develop a very fast heartbeat that does not get better.  You develop dizziness that does not go away.  You faint or constantly feel like you are about to faint. Summary  An ambulatory cardiac monitor is a small recording device that is used to detect abnormal heart rhythms (arrhythmias).  Make sure you understand how to send the information from the monitor to your health care provider.  It is important to press the button on the monitor when you have any heart-related symptoms.  Keep a diary of your  activities, such as walking, doing chores, and taking medicine. It is very important to note what you were doing when you pushed the button to record your symptoms. This will help your health care provider learn what might be causing your symptoms. This information is not intended to replace advice given to you by your health care provider. Make sure you discuss any questions you have with your health care provider. Document Released: 09/05/2008 Document Revised: 09/12/2017 Document Reviewed: 11/11/2016 Elsevier Interactive Patient Education  2019 [employer].

## 2019-01-16 NOTE — Progress Notes (Signed)
Cardiology Office Note:    Date:  01/16/2019   ID:  Larry Kelly, DOB [birth year], MRN [id number]  PCP:  Angelina Sheriff, MD  Cardiologist:  Jenne Campus, MD    Referring MD: Angelina Sheriff, MD   Chief Complaint  Patient presents with  . 1 month follow up  I was in the hospital  History of Present Illness:    Larry Kelly is a 68 y.o. 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  He showed up in my office about 2 months ago because of abnormal EKG.  He is electrocardiogram showed right bundle branch block and I was asked to evaluate this problem.  So far evaluation included echocardiogram which showed normal left ventricular ejection fraction as well as normal right ventricle function and pulmonary pressure.  In the meantime he ended up getting to the hospital.  Apparently 1 day he started having chest pain quite significant and going to the emergency room in the emergency room he ruled out for myocardial infarction he did not have any acute changes on the EKG eventually ended up being admitted and quite extensive work-up has been performed that included CT of his chest as well as abdomen.  His aorta was slightly ectatic in the proximal portion but otherwise intact.  He also had echocardiogram done which confirmed the finding that were identified previously in echocardiogram done in the office.  He also had a stress test which showed no evidence of ischemia.  He comes today to my office for evaluation he described also some additional symptoms the back somewhat concerning he described episodes of him getting very weak tired and exhausted.  Actually when he had his chest pain that brought him to the hospital he was find to have low blood pressure.  Dizziness is not associated with syncope.  This  episode happening almost every single day.  Recently he was also evaluated by neurology for some peculiar neurological symptoms.  MRI of his head neck as well as thoracic spine has been performed.  No results being interpreted by neurologist yet.  However had a chance to look at this results in the computer I do not see anything alarming there.  Past Medical History:  Diagnosis Date  . Anxiety   . 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 Erectile dysfunction   . Headache   . History of carotid artery dissection   . Thoracic aortic aneurysm ([place])   . Tremor, essential   . Vision abnormalities     Past Surgical History:  Procedure Laterality Date  . carotid artery dissec  1997  . CERVICAL DISC SURGERY    . HERNIA REPAIR    . HERNIA REPAIR  2011    Current Medications: Current Meds  Medication Sig  . ALPRAZolam (XANAX) 0.25 MG tablet Take 0.25 mg by mouth at bedtime as needed for anxiety.  Marland Kitchen aspirin EC 81 MG tablet Take 81 mg daily by mouth.  . gabapentin (NEURONTIN) 300 MG capsule Take 1 capsule (300 mg total) by mouth at bedtime. (Patient taking differently: Take 300 mg by mouth 2 (two) times daily as needed. )  . polyethylene glycol (MIRALAX / GLYCOLAX) packet Take 17 g by mouth daily.  . tadalafil (CIALIS) 20 MG tablet Take 20 mg daily as needed by mouth  for erectile dysfunction.     Allergies:   Lexapro [escitalopram oxalate]   Social History   Socioeconomic History  . Marital status: Divorced    Spouse name: Not on file  . Number of children: 0  . Years of education: Not on file  . Highest education level: Bachelor's degree (e.g., BA, AB, BS)  Occupational History  . Occupation: Nurse, learning disability  Social Needs  . Financial resource strain: Not on file  . Food insecurity:    Worry: Not on file    Inability: Not on file  . Transportation needs:    Medical: Not on file    Non-medical: Not on file  Tobacco Use  . Smoking status: Former Smoker    Types: Cigars    Last attempt to  quit: 10/05/2017    Years since quitting: 1.2  . Smokeless tobacco: Never Used  Substance and Sexual Activity  . Alcohol use: Yes    Comment: 1 drink most days-varies, beer, wine, cocktails  . Drug use: No  . Sexual activity: Not on file  Lifestyle  . Physical activity:    Days per week: Not on file    Minutes per session: Not on file  . Stress: Not on file  Relationships  . Social connections:    Talks on phone: Not on file    Gets together: Not on file    Attends religious service: Not on file    Active member of club or organization: Not on file    Attends meetings of clubs or organizations: Not on file    Relationship status: Not on file  Other Topics Concern  . 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 Drinks 2 cups of coffee a day.  Has steady girlfriend.      Family History: The patient's family history includes Atrial fibrillation in his father; Heart attack in his father and mother; Heart disease in his brother; Hypertension in his father; Prostate cancer in his brother; Seizures in his father. ROS:   Please see the history of present illness.    All 14 point review of systems negative except as described per history of present illness  EKGs/Labs/Other Studies Reviewed:      Recent Labs: No results found for requested labs within last 8760 hours.  Recent Lipid Panel No results found for: CHOL, TRIG, HDL, CHOLHDL, VLDL, LDLCALC, LDLDIRECT  Physical Exam:    VS:  BP 120/72   Pulse 84   Ht 5\' 6"  (1.676 m)   Wt 172 lb (78 kg)   SpO2 98%   BMI 27.76 kg/m     Wt Readings from Last 3 Encounters:  01/16/19 172 lb (78 kg)  12/27/18 171 lb 6.4 oz (77.7 kg)  12/03/18 173 lb 3.2 oz (78.6 kg)     GEN:  Well nourished, well developed in no acute distress HEENT: Normal NECK: No JVD; No carotid bruits LYMPHATICS: No lymphadenopathy CARDIAC: RRR, no murmurs, no rubs, no gallops RESPIRATORY:  Clear  to auscultation without rales, wheezing or rhonchi  ABDOMEN: Soft, non-tender, non-distended MUSCULOSKELETAL:  No edema; No deformity  SKIN: Warm and dry LOWER EXTREMITIES: no swelling NEUROLOGIC:  Alert and oriented x 3 PSYCHIATRIC:  Normal affect   ASSESSMENT:    1. Palpitations   2. Dizziness   3. Thoracic aortic aneurysm without rupture ([place])   4. Right bundle branch block   5. Essential hypertension   6.  Atypical chest pain    PLAN:    In order of problems listed above:  1. Palpitations.  I will ask him to wear monitor for 7 days days obviously the fact that he has some dizziness associated with it this make me very concerned 2. Thoracic aortic aneurysm without rupture diameter measured by CT was 3.7 cm want no evidence of dissection. 3. Right bundle branch block less concerning however with his dizziness and palpitations and think it would be appropriate to perform monitor. 4. Atypical chest pain.  Likely stress test was good-quality and negative. 5. Essential hypertension blood pressure appears to be well controlled.  Constellation of some atypical chest pain with some additional peculiar symptoms.  So far work-up negative we will look for any potential arrhythmia as a cause of his symptoms.   Medication Adjustments/Labs and Tests Ordered: Current medicines are reviewed at length with the patient today.  Concerns regarding medicines are outlined above.  Orders Placed This Encounter  Procedures  . LONG TERM MONITOR (3-14 DAYS)   Medication changes: No orders of the defined types were placed in this encounter.   Signed, Park Liter, MD, [facility] 01/16/2019 1:54 PM    [place]

## 2019-01-20 ENCOUNTER — Telehealth: Payer: Self-pay | Admitting: *Deleted

## 2019-01-20 ENCOUNTER — Telehealth: Payer: Self-pay | Admitting: Diagnostic Neuroimaging

## 2019-01-20 NOTE — Telephone Encounter (Signed)
LVM informing the patient that his MRI brain and MRI cervical spine had unremarkable results. The MRI thoracic spine showed some protrusion at L3-4 discs; Dr Leta Baptist recommends a MRI lumbar spine for better evaluation. I requested he call back to advise if he would like MRI lumbar spine.

## 2019-01-20 NOTE — Telephone Encounter (Signed)
Scans were changes to (without) contrast. -VRP

## 2019-01-20 NOTE — Telephone Encounter (Signed)
Larry Kelly on Larry Kelly called wanting to cancel pts MRI w/contrast apparently they had trouble with sticking him. She would like to be called before the next one is scheduled.

## 2019-01-20 NOTE — Telephone Encounter (Signed)
Didn't they get images from the MRI? It was never canceled?

## 2019-01-20 NOTE — Telephone Encounter (Signed)
I don't know how this is going to work with the billing part since he already had the MRI. Do you want him to come back to get the contrast?

## 2019-01-22 NOTE — Telephone Encounter (Addendum)
Returned call from  [place], [place] . She stated the patient would like to go ahead with MRI lumbar spine. She asked if the other 3  MRIs could be repeated with contrast to "see if anything was missed in the scans without contrast". I advised will have to discuss with Dr Leta Baptist and let her know.  She  verbalized understanding, appreciation and stated she needs to be called @ [phone] to schedule MRI L spine.

## 2019-01-27 NOTE — Telephone Encounter (Signed)
[name] called back and stated she has not heard back about the MRI she discussed with Verneita Griffes RN. She would like an update. Please call and advise.

## 2019-01-28 ENCOUNTER — Other Ambulatory Visit: Payer: Self-pay | Admitting: Diagnostic Neuroimaging

## 2019-01-28 DIAGNOSIS — M6250 Muscle wasting and atrophy, not elsewhere classified, unspecified site: Secondary | ICD-10-CM

## 2019-01-28 DIAGNOSIS — G3281 Cerebellar ataxia in diseases classified elsewhere: Secondary | ICD-10-CM

## 2019-01-28 NOTE — Progress Notes (Signed)
Orders Placed This Encounter  Procedures  . MR LUMBAR SPINE WO CONTRAST  . NCV with EMG(electromyography)    Penni Bombard, MD 2/92/4462, 8:63 PM Certified in Neurology, Neurophysiology and Neuroimaging  [facility] [address], [place], [place] [address] [phone]

## 2019-01-28 NOTE — Telephone Encounter (Signed)
LVM for wife informing her Dr Leta Baptist will not repeat MRI at this time. He has ordered further testing with a NCS and MRI Lumbar spine. Advised her the office is now closed; left number for any questions.

## 2019-01-30 ENCOUNTER — Telehealth: Payer: Self-pay | Admitting: Diagnostic Neuroimaging

## 2019-01-30 NOTE — Telephone Encounter (Signed)
[employer] pending faxed clinical notes

## 2019-02-04 ENCOUNTER — Telehealth: Payer: Self-pay | Admitting: Diagnostic Neuroimaging

## 2019-02-04 NOTE — Telephone Encounter (Signed)
lvm for pt to call back about scheduling [facility] Auth: [id number] (exp. 01/30/19 to 03/16/19)

## 2019-02-04 NOTE — Telephone Encounter (Signed)
I spoke to the patient he is scheduled for 02/11/19 at [facility].

## 2019-02-04 NOTE — Telephone Encounter (Signed)
Pt has called Raquel Sarna back, he is asking for a call back

## 2019-02-04 NOTE — Telephone Encounter (Signed)
lvm for pt to call back about scheduling [facility] Auth: [id number] (exp. 01/30/19 to 03/16/19)

## 2019-02-11 ENCOUNTER — Ambulatory Visit (INDEPENDENT_AMBULATORY_CARE_PROVIDER_SITE_OTHER): Payer: 59

## 2019-02-11 DIAGNOSIS — R292 Abnormal reflex: Secondary | ICD-10-CM

## 2019-02-11 DIAGNOSIS — G3281 Cerebellar ataxia in diseases classified elsewhere: Secondary | ICD-10-CM

## 2019-02-14 ENCOUNTER — Telehealth: Payer: Self-pay | Admitting: *Deleted

## 2019-02-14 NOTE — Telephone Encounter (Signed)
Larry Kelly informing patient that his MRI lumbar spine showed moderate spinal stenosis at L2-3. Advised him Dr Leta Baptist stated he can consider a spine surgery consult. I advised the office is now closed, left number and requested he call back Monday to advise.

## 2019-03-06 ENCOUNTER — Encounter: Payer: 59 | Admitting: Diagnostic Neuroimaging

## 2019-04-15 ENCOUNTER — Telehealth: Payer: Self-pay | Admitting: Diagnostic Neuroimaging

## 2019-04-15 NOTE — Telephone Encounter (Signed)
04/15/19 - Called patient to schedule NCV / EMG study and r/s follow-up. Patient states he will call back to r/s.

## 2019-04-16 ENCOUNTER — Ambulatory Visit: Payer: 59 | Admitting: Diagnostic Neuroimaging

## 2019-05-28 ENCOUNTER — Telehealth: Payer: Self-pay

## 2019-05-28 NOTE — Telephone Encounter (Signed)
I called and LVM for patient to call us back to reschedule his NCV/EMG with Dr. Leta Baptist

## 2019-06-20 IMAGING — MR MR CERVICAL SPINE W/O CM
4 of 5 series · 24 of 48 positions shown · non-contrast
Comparison: Brain MRI 10/02/2017 and cervical spine MRI 08/28/2005

CLINICAL DATA: Neck pain, posterior headaches, gait instability.
Symptoms began 3 months ago. hyperreflexia. Prior cervical spinal
fusion.

EXAM:
MRI CERVICAL SPINE WITHOUT CONTRAST
TECHNIQUE: Multiplanar, multisequence MR imaging of the cervical spine was
performed. No intravenous contrast was administered.

[Series 3: T2 post-contrast · sagittal · 3.3mm · 0.37mm/px · 6 of 13 slices shown]
[im 1/13]
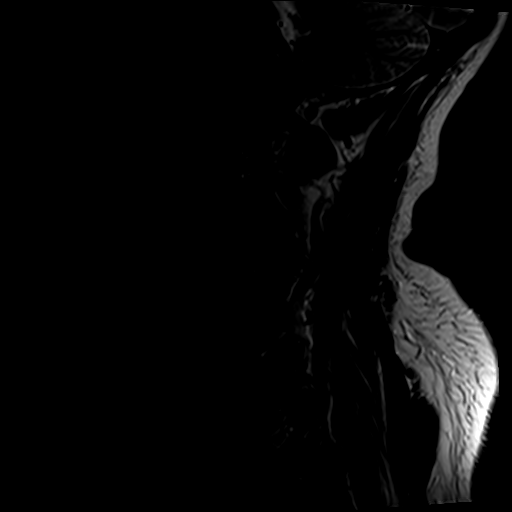
[im 3/13]
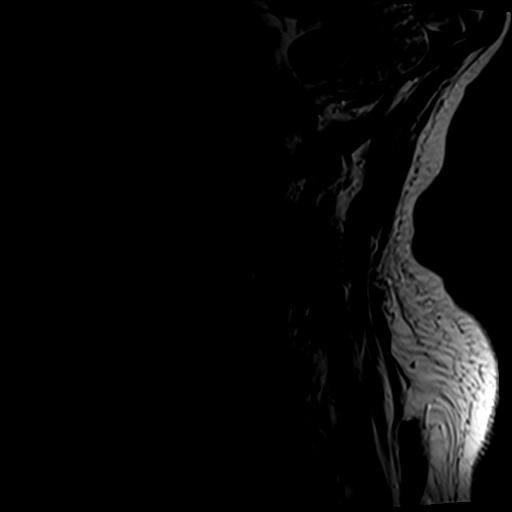
[im 5/13]
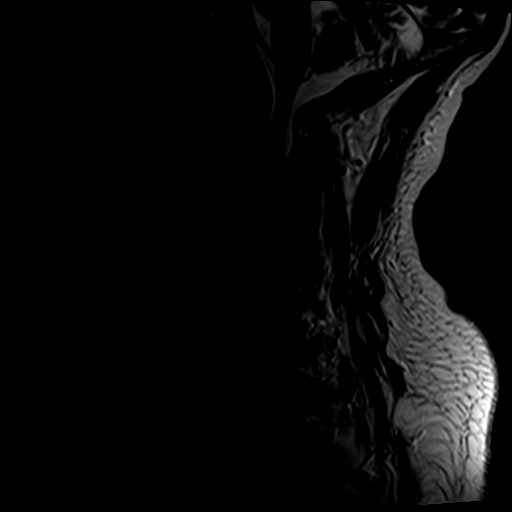
[im 8/13]
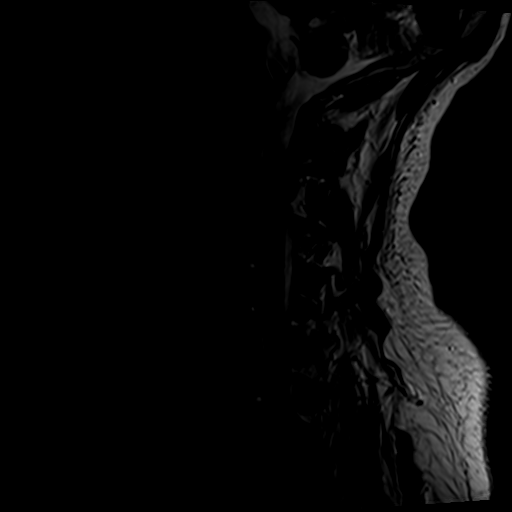
[im 10/13]
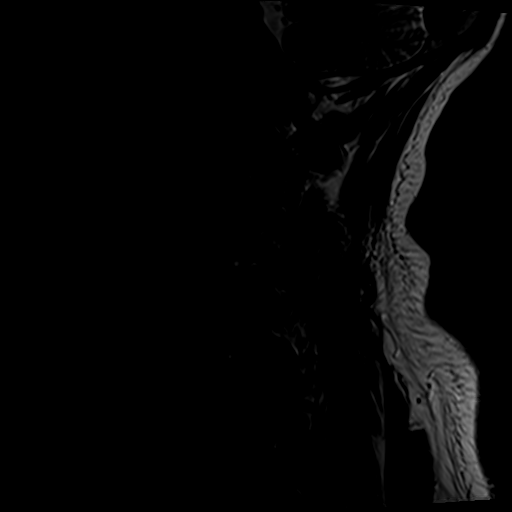
[im 13/13]
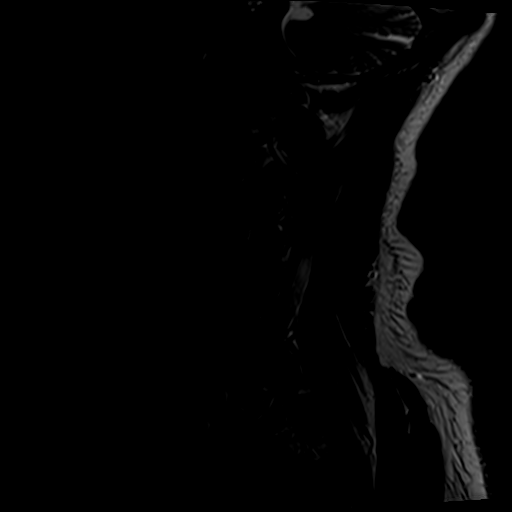

[Series 4: T1 · sagittal · 3.3mm · 0.37mm/px · 6 of 13 slices shown]
[im 1/13]
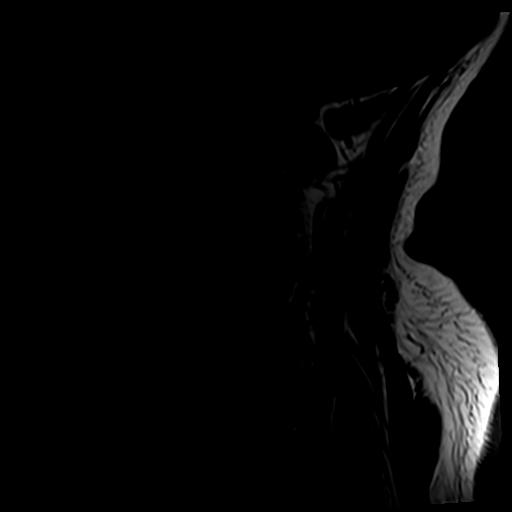
[im 3/13]
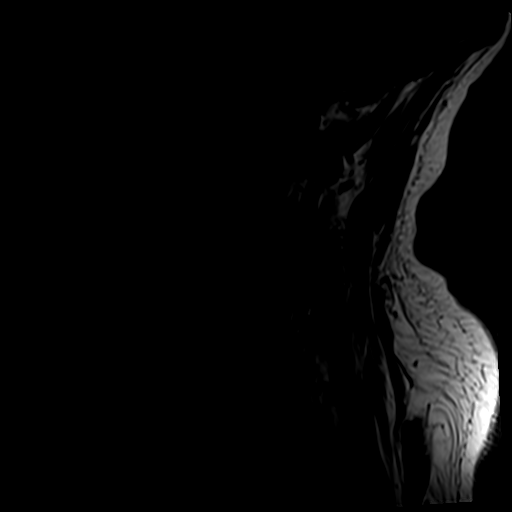
[im 5/13]
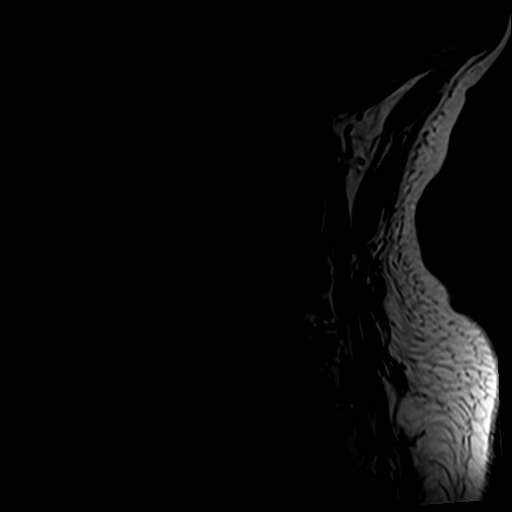
[im 8/13]
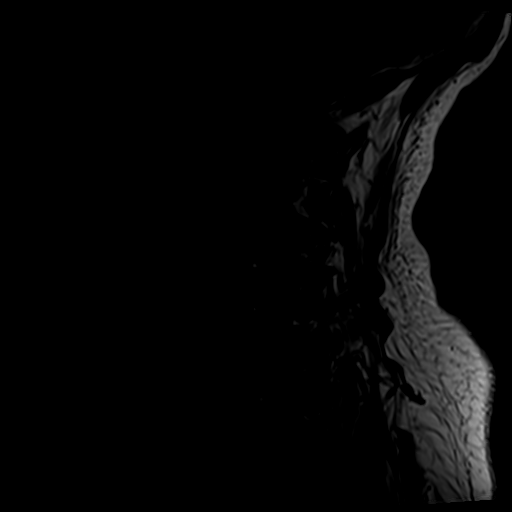
[im 10/13]
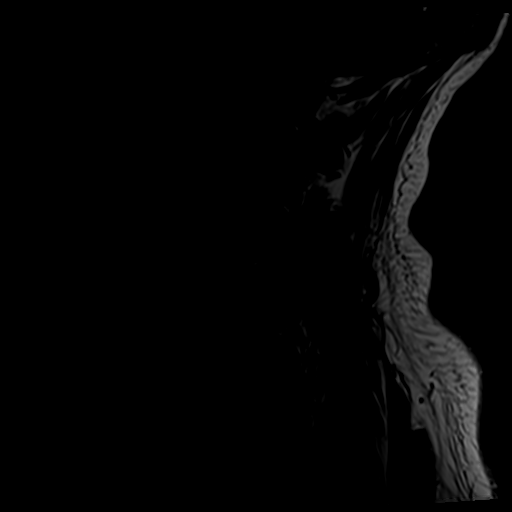
[im 13/13]
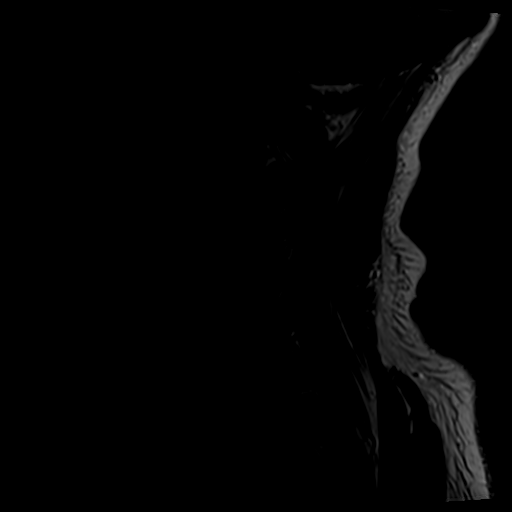

[Series 5: tir sag · sagittal · 3.3mm · 0.37mm/px · 3 of 13 slices shown]
[im 3/13]
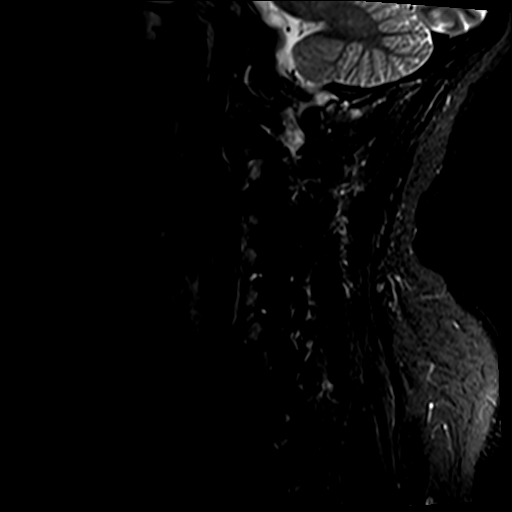
[im 8/13]
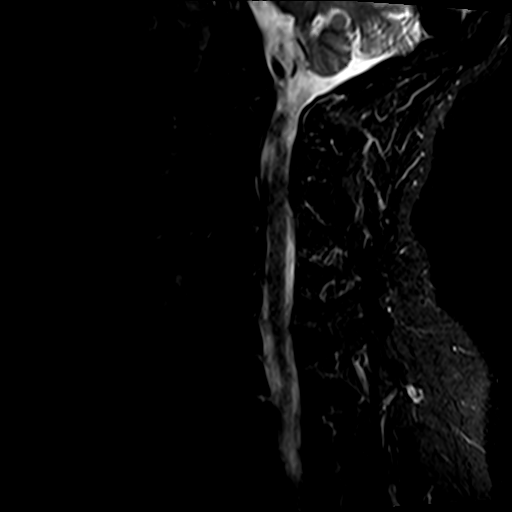
[im 13/13]
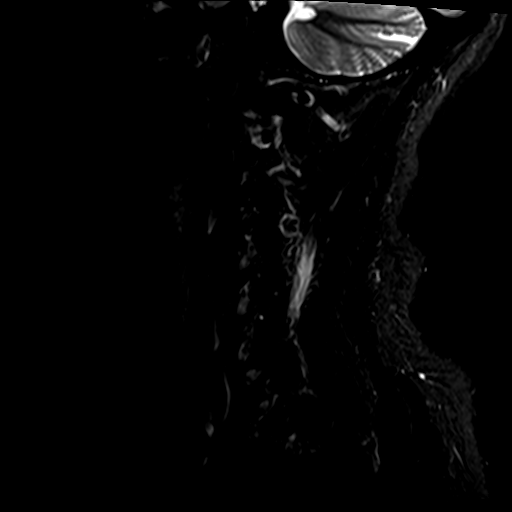

[Series 7: T2 · axial · 3.0mm · 0.70mm/px · z∈[-75,+43]mm · 9 of 32 slices shown]
[im 1/32]
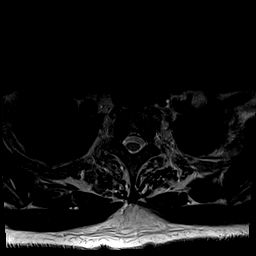
[im 5/32]
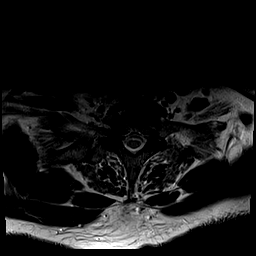
[im 9/32]
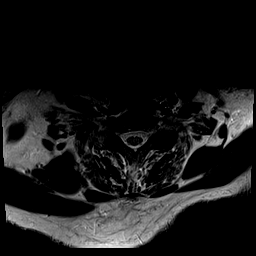
[im 14/32]
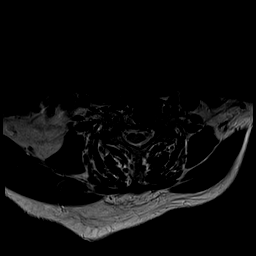
[im 16/32]
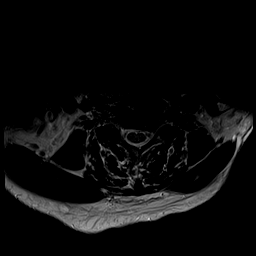
[im 18/32]
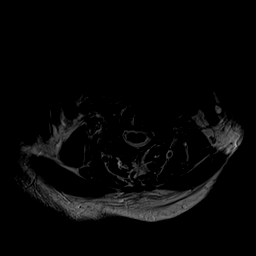
[im 23/32]
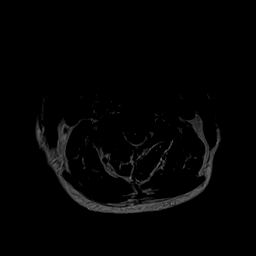
[im 27/32]
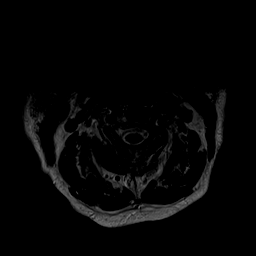
[im 32/32]
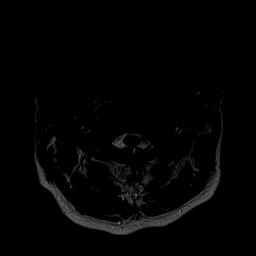

[24 of 48 positions shown; findings below may reference images not displayed]

FINDINGS: Alignment: Cervical spine straightening. Minimal anterolisthesis of
C3 on C4, new from 7441.

Vertebrae: Congenital C2-3 fusion. C5-T1 ACDF, new from 7441. No
fracture or suspicious osseous lesion. Mild degenerative endplate
changes at T1-2.

Cord: Normal signal and morphology.

Posterior Fossa, vertebral arteries, paraspinal tissues: Small
volume fluid between the CT spinous process and posterior C1 ring,
new from 7441 and possibly within a bursa. Preserved vertebral
artery flow voids.

Disc levels:

C1-2: Progressive ligamentous thickening/ pannus formation about the
dens since 7441 resulting in mild spinal stenosis. The posterior C1
ring is incomplete, and posterior spurring and/or ligamentous
thickening also contributes to the spinal stenosis.

C2-3:  Congenital anterior and posterior fusion.  No stenosis.

C3-4: Anterolisthesis with disc uncovering, small central pseudo
disc protrusion, uncovertebral spurring, and severe right and mild
left facet arthrosis result in mild spinal stenosis and severe right
and mild left neural foraminal stenosis. Findings have progressed at
this level since 7441, particularly the right-sided facet arthrosis
and associated neural foraminal stenosis.

C4-5: Mild disc bulging, uncovertebral spurring, and moderate right
facet arthrosis result in new mild right neural foraminal stenosis
without spinal stenosis.

C5-6: ACDF. No spinal stenosis. Uncovertebral spurring results in
mild left neural foraminal stenosis.

C6-7: ACDF. No spinal stenosis. At most mild residual left neural
foraminal narrowing due to uncovertebral spurring.

C7-T1: ACDF. No spinal stenosis. Mild-to-moderate residual right
neural foraminal narrowing due to uncovertebral spurring.

T1-2: Progressive disc degeneration with severe disc space
narrowing. Disc bulging and endplate spurring result in increased,
severe bilateral neural foraminal stenosis. There is a small central
disc protrusion without significant spinal stenosis.
IMPRESSION: 1. C5-T1 ACDF without residual spinal stenosis. Mild residual neural
foraminal narrowing as above.
2. Adjacent segment disease at T1-2 with severe bilateral neural
foraminal stenosis.
3. Progressive disc degeneration and severe right facet arthrosis at
C3-4 with new anterolisthesis and severe right and mild left neural
foraminal stenosis.
4. Progressive C1-2 arthropathy with mild spinal stenosis.

## 2019-12-02 DIAGNOSIS — K219 Gastro-esophageal reflux disease without esophagitis: Secondary | ICD-10-CM

## 2019-12-02 DIAGNOSIS — I361 Nonrheumatic tricuspid (valve) insufficiency: Secondary | ICD-10-CM | POA: Diagnosis not present

## 2019-12-02 DIAGNOSIS — R0602 Shortness of breath: Secondary | ICD-10-CM | POA: Diagnosis not present

## 2019-12-02 DIAGNOSIS — E785 Hyperlipidemia, unspecified: Secondary | ICD-10-CM

## 2019-12-02 DIAGNOSIS — D649 Anemia, unspecified: Secondary | ICD-10-CM | POA: Diagnosis not present

## 2019-12-03 ENCOUNTER — Other Ambulatory Visit (HOSPITAL_COMMUNITY)
Admission: RE | Admit: 2019-12-03 | Discharge: 2019-12-03 | Disposition: A | Discharge status: Home / Self Care | Payer: 59 | Source: Ambulatory Visit | Attending: Oncology | Admitting: Oncology

## 2019-12-03 DIAGNOSIS — D72829 Elevated white blood cell count, unspecified: Secondary | ICD-10-CM

## 2019-12-03 DIAGNOSIS — R0602 Shortness of breath: Secondary | ICD-10-CM | POA: Diagnosis not present

## 2019-12-03 DIAGNOSIS — D649 Anemia, unspecified: Secondary | ICD-10-CM

## 2019-12-03 DIAGNOSIS — K219 Gastro-esophageal reflux disease without esophagitis: Secondary | ICD-10-CM | POA: Diagnosis not present

## 2019-12-03 DIAGNOSIS — E785 Hyperlipidemia, unspecified: Secondary | ICD-10-CM | POA: Diagnosis not present

## 2019-12-03 DIAGNOSIS — D696 Thrombocytopenia, unspecified: Secondary | ICD-10-CM

## 2019-12-07 LAB — SURGICAL PATHOLOGY

## 2019-12-25 DIAGNOSIS — C250 Malignant neoplasm of head of pancreas: Secondary | ICD-10-CM

## 2019-12-25 DIAGNOSIS — C9200 Acute myeloblastic leukemia, not having achieved remission: Secondary | ICD-10-CM

## 2020-01-12 DIAGNOSIS — C9250 Acute myelomonocytic leukemia, not having achieved remission: Secondary | ICD-10-CM | POA: Diagnosis not present

## 2020-02-23 DIAGNOSIS — C9250 Acute myelomonocytic leukemia, not having achieved remission: Secondary | ICD-10-CM | POA: Diagnosis not present

## 2020-03-25 DIAGNOSIS — C9250 Acute myelomonocytic leukemia, not having achieved remission: Secondary | ICD-10-CM

## 2020-05-24 DIAGNOSIS — C9250 Acute myelomonocytic leukemia, not having achieved remission: Secondary | ICD-10-CM | POA: Diagnosis not present

## 2020-07-12 DIAGNOSIS — C9250 Acute myelomonocytic leukemia, not having achieved remission: Secondary | ICD-10-CM

## 2020-09-13 ENCOUNTER — Other Ambulatory Visit: Payer: Self-pay | Admitting: Hematology and Oncology

## 2020-09-13 ENCOUNTER — Inpatient Hospital Stay: Payer: Medicare Other | Attending: Hematology and Oncology

## 2020-09-13 ENCOUNTER — Other Ambulatory Visit: Payer: Self-pay

## 2020-09-13 DIAGNOSIS — D4622 Refractory anemia with excess of blasts 2: Secondary | ICD-10-CM | POA: Diagnosis present

## 2020-09-13 DIAGNOSIS — D696 Thrombocytopenia, unspecified: Secondary | ICD-10-CM

## 2020-09-13 LAB — BASIC METABOLIC PANEL
BUN: 24 — AB (ref 4–21)
CO2: 21 (ref 13–22)
Chloride: 100 (ref 99–108)
Creatinine: 1.1 (ref 0.6–1.3)
Glucose: 112
Potassium: 3.7 (ref 3.4–5.3)
Sodium: 133 — AB (ref 137–147)

## 2020-09-13 LAB — CBC AND DIFFERENTIAL
HCT: 28 — AB (ref 41–53)
Hemoglobin: 9.5 — AB (ref 13.5–17.5)
Neutrophils Absolute: 2
Platelets: 4 — AB (ref 150–399)
WBC: 3.2

## 2020-09-13 LAB — COMPREHENSIVE METABOLIC PANEL
Albumin: 3.5 (ref 3.5–5.0)
Calcium: 9.2 (ref 8.7–10.7)

## 2020-09-13 LAB — HEPATIC FUNCTION PANEL
ALT: 16 (ref 10–40)
AST: 30 (ref 14–40)
Bilirubin, Total: 0.5

## 2020-09-13 LAB — CBC: RBC: 3.1 — AB (ref 3.87–5.11)

## 2020-09-14 ENCOUNTER — Inpatient Hospital Stay: Payer: Medicare Other

## 2020-09-14 ENCOUNTER — Other Ambulatory Visit: Payer: Self-pay

## 2020-09-14 DIAGNOSIS — D4622 Refractory anemia with excess of blasts 2: Secondary | ICD-10-CM | POA: Diagnosis not present

## 2020-09-14 DIAGNOSIS — D696 Thrombocytopenia, unspecified: Secondary | ICD-10-CM

## 2020-09-14 LAB — ABO/RH: ABO/RH(D): O POS

## 2020-09-14 MED ORDER — ACETAMINOPHEN 325 MG PO TABS
650.0000 mg | ORAL_TABLET | Freq: Once | ORAL | Status: AC
  Administered 2020-09-14: 650 mg via ORAL

## 2020-09-14 MED ORDER — SODIUM CHLORIDE 0.9% FLUSH
10.0000 mL | Freq: Once | INTRAVENOUS | Status: AC
  Administered 2020-09-14: 10 mL via INTRAVENOUS
  Filled 2020-09-14: qty 10

## 2020-09-14 MED ORDER — HEPARIN SOD (PORK) LOCK FLUSH 100 UNIT/ML IV SOLN
500.0000 [IU] | Freq: Once | INTRAVENOUS | Status: AC
  Administered 2020-09-14: 500 [IU] via INTRAVENOUS
  Filled 2020-09-14: qty 5

## 2020-09-14 MED ORDER — DIPHENHYDRAMINE HCL 50 MG/ML IJ SOLN
INTRAMUSCULAR | Status: AC
  Filled 2020-09-14: qty 1

## 2020-09-14 MED ORDER — ACETAMINOPHEN 325 MG PO TABS
ORAL_TABLET | ORAL | Status: AC
  Filled 2020-09-14: qty 2

## 2020-09-14 MED ORDER — SODIUM CHLORIDE 0.9% IV SOLUTION
250.0000 mL | Freq: Once | INTRAVENOUS | Status: AC
  Administered 2020-09-14: 250 mL via INTRAVENOUS
  Filled 2020-09-14: qty 250

## 2020-09-14 MED ORDER — DIPHENHYDRAMINE HCL 25 MG PO CAPS
25.0000 mg | ORAL_CAPSULE | Freq: Once | ORAL | Status: AC
  Administered 2020-09-14: 25 mg via ORAL

## 2020-09-14 MED ORDER — DIPHENHYDRAMINE HCL 25 MG PO CAPS
ORAL_CAPSULE | ORAL | Status: AC
  Filled 2020-09-14: qty 1

## 2020-09-14 NOTE — Progress Notes (Signed)
PT IS STABLE AT DISCHARGE. 

## 2020-09-15 LAB — BPAM PLATELET PHERESIS
Blood Product Expiration Date: 202110072359
ISSUE DATE / TIME: 202110050855
Unit Type and Rh: 7300

## 2020-09-15 LAB — PREPARE PLATELET PHERESIS: Unit division: 0

## 2020-09-15 LAB — ABO/RH: ABO/RH(D): O POS

## 2020-09-16 ENCOUNTER — Inpatient Hospital Stay: Payer: Medicare Other

## 2020-09-16 ENCOUNTER — Other Ambulatory Visit: Payer: Self-pay | Admitting: Hematology and Oncology

## 2020-09-16 DIAGNOSIS — D696 Thrombocytopenia, unspecified: Secondary | ICD-10-CM

## 2020-09-16 DIAGNOSIS — C9250 Acute myelomonocytic leukemia, not having achieved remission: Secondary | ICD-10-CM

## 2020-09-16 LAB — CBC AND DIFFERENTIAL
HCT: 19 — AB (ref 41–53)
Hemoglobin: 6.4 — AB (ref 13.5–17.5)
Neutrophils Absolute: 2010
Platelets: 7 — AB (ref 150–399)
WBC: 4.1

## 2020-09-16 LAB — CBC: RBC: 2.1 — AB (ref 3.87–5.11)

## 2020-09-17 ENCOUNTER — Other Ambulatory Visit: Payer: Self-pay | Admitting: Pharmacist

## 2020-09-17 ENCOUNTER — Other Ambulatory Visit: Payer: Self-pay | Admitting: Hematology and Oncology

## 2020-09-17 ENCOUNTER — Inpatient Hospital Stay: Payer: Medicare Other

## 2020-09-17 DIAGNOSIS — D6189 Other specified aplastic anemias and other bone marrow failure syndromes: Secondary | ICD-10-CM

## 2020-09-17 DIAGNOSIS — D4622 Refractory anemia with excess of blasts 2: Secondary | ICD-10-CM | POA: Diagnosis not present

## 2020-09-17 LAB — BPAM PLATELET PHERESIS
Blood Product Expiration Date: 202110102359
ISSUE DATE / TIME: 202110080857
Unit Type and Rh: 6200

## 2020-09-17 LAB — PREPARE PLATELET PHERESIS: Unit division: 0

## 2020-09-17 LAB — PREPARE RBC (CROSSMATCH)

## 2020-09-19 LAB — BPAM RBC
Blood Product Expiration Date: 202111022359
Blood Product Expiration Date: 202111022359
ISSUE DATE / TIME: 202110080852
ISSUE DATE / TIME: 202110080852
Unit Type and Rh: 5100
Unit Type and Rh: 5100

## 2020-09-19 LAB — TYPE AND SCREEN
ABO/RH(D): O POS
Antibody Screen: NEGATIVE
Unit division: 0
Unit division: 0

## 2020-10-11 NOTE — Progress Notes (Unsigned)
Test

## 2020-10-11 DEATH — deceased
